# Patient Record
Sex: Female | Born: 1965 | Race: White | Hispanic: No | Marital: Married | Smoking: Never smoker
Health system: Southern US, Community
[De-identification: ages and names within clinical notes are randomized; demographics above are authoritative.]

## PROBLEM LIST (undated history)

## (undated) HISTORY — PX: WISDOM TOOTH EXTRACTION: SHX21

---

## 1998-01-30 ENCOUNTER — Inpatient Hospital Stay (HOSPITAL_COMMUNITY): Admission: AD | Admit: 1998-01-30 | Discharge: 1998-02-02 | Payer: Self-pay | Admitting: Obstetrics and Gynecology

## 1998-07-21 ENCOUNTER — Encounter (HOSPITAL_COMMUNITY): Admission: RE | Admit: 1998-07-21 | Discharge: 1998-10-19 | Payer: Self-pay | Admitting: *Deleted

## 1999-07-26 ENCOUNTER — Other Ambulatory Visit: Admission: RE | Admit: 1999-07-26 | Discharge: 1999-07-26 | Payer: Self-pay | Admitting: Obstetrics & Gynecology

## 2001-08-26 ENCOUNTER — Other Ambulatory Visit: Admission: RE | Admit: 2001-08-26 | Discharge: 2001-08-26 | Payer: Self-pay | Admitting: Obstetrics and Gynecology

## 2003-01-19 ENCOUNTER — Other Ambulatory Visit: Admission: RE | Admit: 2003-01-19 | Discharge: 2003-01-19 | Payer: Self-pay | Admitting: Obstetrics and Gynecology

## 2005-03-21 ENCOUNTER — Other Ambulatory Visit: Admission: RE | Admit: 2005-03-21 | Discharge: 2005-03-21 | Payer: Self-pay | Admitting: Obstetrics and Gynecology

## 2006-01-28 ENCOUNTER — Other Ambulatory Visit: Admission: RE | Admit: 2006-01-28 | Discharge: 2006-01-28 | Payer: Self-pay | Admitting: Obstetrics and Gynecology

## 2007-05-11 ENCOUNTER — Inpatient Hospital Stay (HOSPITAL_COMMUNITY): Admission: AD | Admit: 2007-05-11 | Discharge: 2007-05-13 | Payer: Self-pay | Admitting: Obstetrics and Gynecology

## 2009-04-12 ENCOUNTER — Ambulatory Visit: Payer: Self-pay | Admitting: Family Medicine

## 2009-09-11 DIAGNOSIS — B3731 Acute candidiasis of vulva and vagina: Secondary | ICD-10-CM | POA: Insufficient documentation

## 2009-09-11 DIAGNOSIS — R32 Unspecified urinary incontinence: Secondary | ICD-10-CM | POA: Insufficient documentation

## 2009-09-13 LAB — HM PAP SMEAR

## 2011-03-05 NOTE — H&P (Signed)
NAMEKWANA, RINGEL NO.:  [id number]   MEDICAL RECORD NO.:  [id number]          PATIENT TYPE:  INP   LOCATION:  9173                          FACILITY:  [facility]   PHYSICIAN:  Janine Limbo, M.D.DATE OF BIRTH:  06/13/1966   DATE OF ADMISSION:  05/11/2007  DATE OF DISCHARGE:                              HISTORY & PHYSICAL   Ms. Counsell is a 45 year old gravida 3, para 2-0-0-2, at 38-3/7 weeks,  who presents today in early labor.  Initially on admission her  contractions were 6-9 minutes apart, cervix was 2 cm, and fetal heart  rate was reactive.  She walked for a time in maternity admissions.  Upon  return after ambulation, her cervix was unchanged; however, she had two  mild variables.  She was sent for ultrasound for AFI and BPP.  BPP was  8/8; however, fluid was low at the 4th percentile.  Contractions by that  time had also picked up somewhat to approximately every 3-5 minutes so  the decision was made in light of the oligohydramnios and early labor to  go ahead and admit.  Pregnancy has been remarkable for:   1. Advanced maternal age this pregnancy with the patient declining      genetic testing.  2. PENICILLIN allergy.  3. Group B strep positive.  4. History of preeclampsia with her last pregnancy.   PRENATAL LABS:  Blood type is O positive, Rh antibody negative.  VDRL  nonreactive.  Rubella titer positive.  Hepatitis B surface antigen  negative.  HIV nonreactive.  GC and chlamydia cultures were negative the  first trimester.  HIV was also nonreactive.  Cystic fibrosis testing was  declined.  Hemoglobin upon entering the practice was 12.9.  It was 11.5  at 26 weeks.  Glucola was normal.  RPR was nonreactive.  Group B strep  culture was positive at 36 weeks.  EDC of May 22, 2007, was  established by ultrasound at approximately 12 weeks.   HISTORY OF PRESENT PREGNANCY:  The patient entered care at approximately  10 weeks by dates but approximately 14  weeks by size.  She had an  ultrasound at that time for dating purposes, which placed her at 12  weeks with an [facility] of May 22, 2007.  She declined all genetic testing  and amniocentesis.  She did have an ultrasound at 18 weeks showing  normal growth and development.  There was a previa noted on the late  first trimester ultrasound, and this was resolved by 18 weeks.  Adjusted  risk for Down syndrome was 1 in 262 after the genetic ultrasound.  She  had another ultrasound at 32 weeks for growth and fluid with no abnormal  findings.  Her Glucola was normal.  Her hemoglobin was also normal at 27  weeks.  Group B strep culture was positive at 36 weeks.  The rest of her  pregnancy has been uncomplicated.   OBSTETRICAL HISTORY:  In 1995 she had a vaginal birth of a female  infant, weight 6 pounds 8 ounces, at 40 weeks.  She was in labor several  days.  She had epidural anesthesia without complication.  In 1999 she  had a vaginal birth of a female infant, weight 6 pounds 8 ounces, at 40  weeks' gestation.  She was in labor 18 hours.  She had epidural  anesthesia.  She did have preeclampsia.   MEDICAL HISTORY:  She reports the usual childhood illnesses.  She has  had one UTI.  She fractured her left collar bone in 1983.   SURGICAL HISTORY:  Wisdom teeth in 1989.   Her only other hospitalizations were for childbirth and for mono and  strep in 1984.   She is allergic to PENICILLIN.   FAMILY HISTORY:  Her father had a heart attack.  Father also had  hypertension.  Her niece had asthma.  Her sister has multiple sclerosis.  Her father has Alzheimer's.  Her mother has rheumatoid arthritis.   GENETIC HISTORY:  Remarkable for the patient's age of 10.  The father of  the baby's cousin has a cleft palate and father of the baby's sisters  have heart defects, were deceased as infants.   SOCIAL HISTORY:  The patient is married to the father of the baby.  He  is involved and supportive.  His name is  [employer], Arts administrator.  The patient  is Caucasian and Hispanic in ethnicity.  She is of the [employer].  She is graduate-educated.  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  Her  husband is graduate-educated, and he is employed as a Runner, broadcasting/film/video.  She has  been followed by the certified nurse midwife service at [facility].  She denies any alcohol, drug or tobacco use during this pregnancy.   PHYSICAL EXAMINATION:  VITAL SIGNS:  Stable.  The patient is afebrile.  HEENT:  Within normal limits.  LUNGS:  Bilateral breath sounds are clear.  HEART: Regular rate and rhythm without murmur.  BREASTS:  Soft and nontender.  ABDOMEN:  Fundal height is approximately 38 cm.  Estimated fetal weight  is 7 to 7-1/2 pounds.  Uterine contractions are every 3-5 minutes,  moderate quality.  Cervical exam is 2 cm, 60%, vertex at a -1 station.  Fetal heart rate is reassuring.  There are some occasional mild  variables with some contractions but the fetal heart rate does go  through cycles of reactivity.  The variables seem to occur when the baby  is in a sleep cycle.  EXTREMITIES:  Deep tendon reflexes are 2+ without clonus.  There is a  trace edema noted.   IMPRESSION:  1. Intrauterine pregnancy at 38-3/7 weeks.  2. Early labor.  3. Oligohydramnios.  4. Positive group B strep with PENICILLIN allergy.   PLAN:  1. Admit to birthing suite per consult with Dr. Marline Backbone as      attending physician.  2. Routine certified nurse midwife orders.  3. Will plan group B strep prophylaxis with clindamycin per standard      dosing.  4. Will anticipate artificial rupture of membranes after clindamycin      dose has been infused.  5. The patient will desire epidural as labor progresses.     Renaldo Reel Emilee Hero, C.N.M.      Janine Limbo, M.D.  Electronically Signed   VLL/MEDQ  D:  05/11/2007  T:  05/11/2007  Job:  [id number]

## 2011-08-05 LAB — CBC
Hemoglobin: 10.7 — ABNORMAL LOW
Platelets: 137 — ABNORMAL LOW
RBC: 3.53 — ABNORMAL LOW
RDW: 14
WBC: 9.8

## 2011-08-05 LAB — RPR: RPR Ser Ql: NONREACTIVE

## 2012-10-19 ENCOUNTER — Telehealth: Payer: Self-pay | Admitting: Obstetrics and Gynecology

## 2012-11-12 ENCOUNTER — Encounter: Payer: Self-pay | Admitting: Obstetrics and Gynecology

## 2012-11-12 ENCOUNTER — Ambulatory Visit: Payer: BC Managed Care – PPO | Admitting: Obstetrics and Gynecology

## 2012-11-12 ENCOUNTER — Other Ambulatory Visit: Payer: Self-pay | Admitting: Obstetrics and Gynecology

## 2012-11-12 VITALS — BP 110/62 | Ht 61.0 in | Wt 142.0 lb

## 2012-11-12 DIAGNOSIS — IMO0001 Reserved for inherently not codable concepts without codable children: Secondary | ICD-10-CM

## 2012-11-12 DIAGNOSIS — Z01419 Encounter for gynecological examination (general) (routine) without abnormal findings: Secondary | ICD-10-CM

## 2012-11-12 DIAGNOSIS — Z1231 Encounter for screening mammogram for malignant neoplasm of breast: Secondary | ICD-10-CM

## 2012-11-12 MED ORDER — DIAPHRAGM ARC-SPRING 75 MM VA DPRH: 1.0000 | VAGINAL_INSERT | VAGINAL | Status: AC | PRN

## 2012-11-12 NOTE — Progress Notes (Unsigned)
Subjective:    Jodi Vincent is a 47 y.o. female, G3P3003, who presents for an annual exam.   Patient reports:  ***.    History   Social History  . Marital Status: Married    Spouse Name: N/A    Number of Children: N/A  . Years of Education: N/A   Social History Main Topics  . Smoking status: None  . Smokeless tobacco: Never Used  . Alcohol Use: Yes  . Drug Use: No  . Sexually Active: Yes    Birth Control/ Protection: Diaphragm   Other Topics Concern  . None   Social History Narrative  . None    Menstrual cycle:   LMP: Patient's last menstrual period was 11/08/2012.           Cycle: ***  The following portions of the patient's history were reviewed and updated as appropriate: allergies, current medications, past family history, past medical history, past social history, past surgical history and problem list.  Review of Systems Pertinent items are noted in HPI. Breast:Negative for breast lump,nipple discharge or nipple retraction Gastrointestinal: Negative for abdominal pain, change in bowel habits or rectal bleeding Urinary:negative   Objective:    BP 110/62  Ht 5\' 1"  (1.549 m)  Wt 142 lb (64.411 kg)  BMI 26.83 kg/m2  LMP 11/08/2012    Weight:  Wt Readings from Last 1 Encounters:  11/12/12 142 lb (64.411 kg)          BMI: Body mass index is 26.83 kg/(m^2).  General Appearance: Alert, appropriate appearance for age. No acute distress HEENT: Grossly normal Neck / Thyroid: Supple, no masses, nodes or enlargement Lungs: clear to auscultation bilaterally Back: No CVA tenderness Breast Exam: {exam; breast:30839::"No masses or nodes.No dimpling, nipple retraction or discharge."} Cardiovascular: Regular rate and rhythm. S1, S2, no murmur Gastrointestinal: Soft, non-tender, no masses or organomegaly Pelvic Exam: {Exam; pelvic:30843} Rectovaginal: {rectal female:311646::"not indicated"} Lymphatic Exam: Non-palpable nodes in neck, clavicular, axillary, or  inguinal regions Skin: no rash or abnormalities Neurologic: Normal gait and speech, no tremor  Psychiatric: Alert and oriented, appropriate affect.   Wet Prep:{pe wet prep/koh:313109::"not applicable"} Urinalysis:{Urine dipstick components:5375::"not applicable"} UPT: {FINDINGS; POSITIVE NEGATIVE:7244569109::"Not done"}   Assessment:    {diagnoses; exam gyn:13148}    Plan:    Mammogram: *** Pap:  *** STD screening: {DESC; DONE/DISCUSSED/DECLINED:14570} Contraception:{PLAN CONTRACEPTION:313102} Other:  ***   Check diaphragm size Mamogram   Derral Colucci, [place], [place]

## 2012-11-12 NOTE — Progress Notes (Unsigned)
Last Pap: 2008 ?  WNL: Yes Regular Periods:yes Contraception: diaphragm   Monthly Breast exam:no Tetanus<66yrs:yes Nl.Bladder Function:yes Daily BMs:yes Healthy Diet:yes Calcium:yes Mammogram:no Exercise:yes Have often Exercise: 3-5 times a week  Seatbelt: yes Abuse at home: no Stressful work:yes PCP: pt stated she doesn't have one  Change in PMH: unchanged Change in [facility] unchanged  Pt stated no issues today.

## 2012-11-13 LAB — PAP IG W/ RFLX HPV ASCU

## 2012-11-24 DIAGNOSIS — B379 Candidiasis, unspecified: Secondary | ICD-10-CM

## 2012-11-24 DIAGNOSIS — B373 Candidiasis of vulva and vagina: Secondary | ICD-10-CM

## 2012-11-24 DIAGNOSIS — B3731 Acute candidiasis of vulva and vagina: Secondary | ICD-10-CM

## 2012-11-24 DIAGNOSIS — R32 Unspecified urinary incontinence: Secondary | ICD-10-CM

## 2012-12-08 ENCOUNTER — Ambulatory Visit
Admission: RE | Admit: 2012-12-08 | Discharge: 2012-12-08 | Disposition: A | Discharge status: Home / Self Care | Payer: BC Managed Care – PPO | Source: Ambulatory Visit | Attending: Obstetrics and Gynecology | Admitting: Obstetrics and Gynecology

## 2012-12-08 DIAGNOSIS — Z1231 Encounter for screening mammogram for malignant neoplasm of breast: Secondary | ICD-10-CM

## 2013-08-13 IMAGING — MG MM DIGITAL SCREENING BILAT
4 series · 4 of 4 positions shown · non-contrast
Comparison: None.

CLINICAL DATA: Screening.  Baseline.

DIGITAL BILATERAL SCREENING MAMMOGRAM WITH CAD

[R CC]
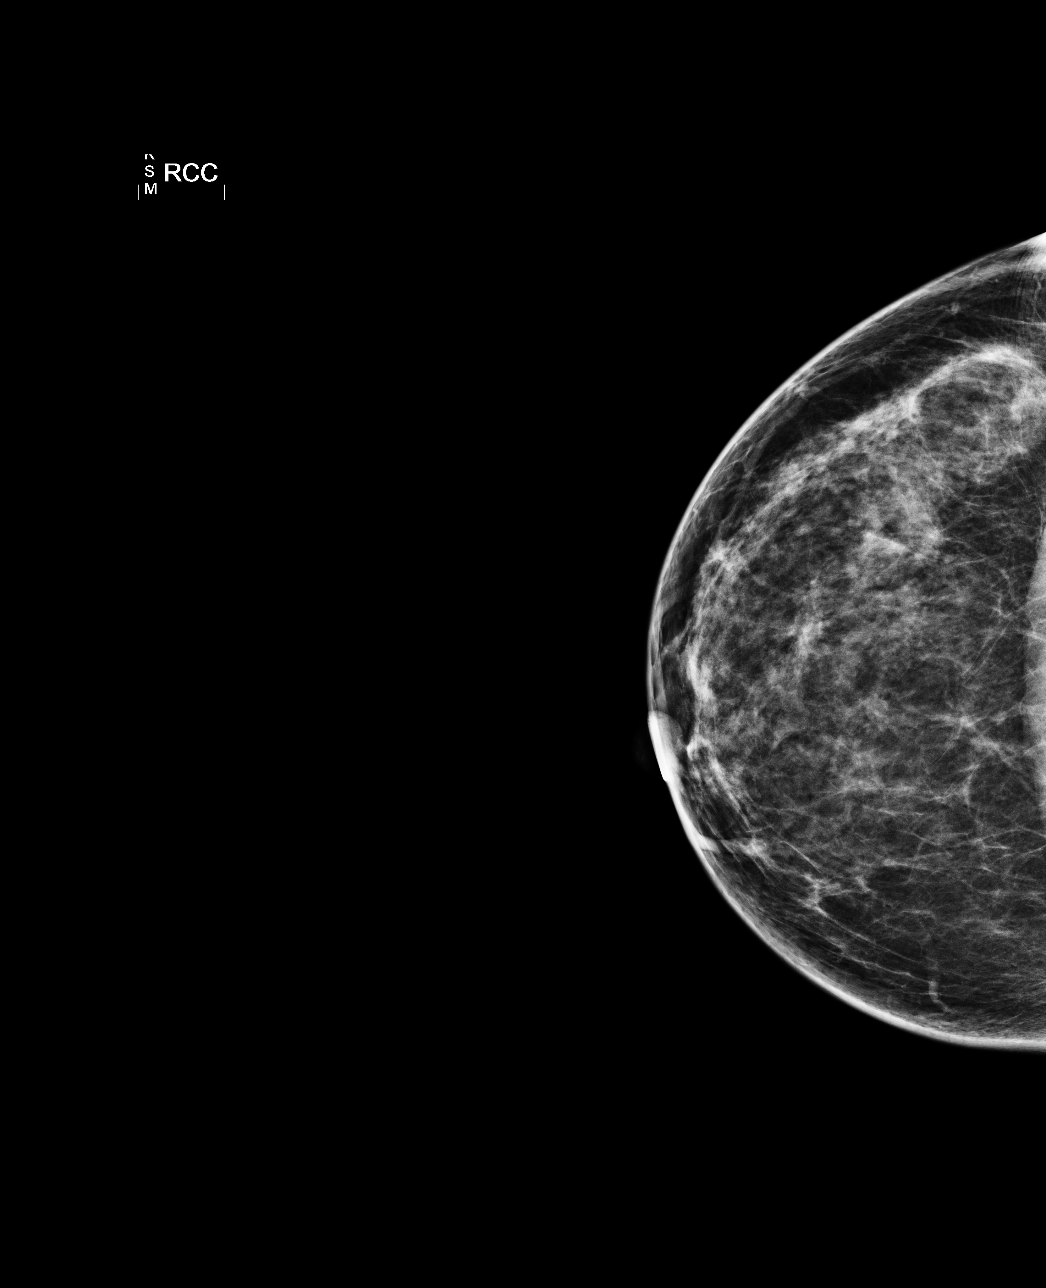

[L CC]
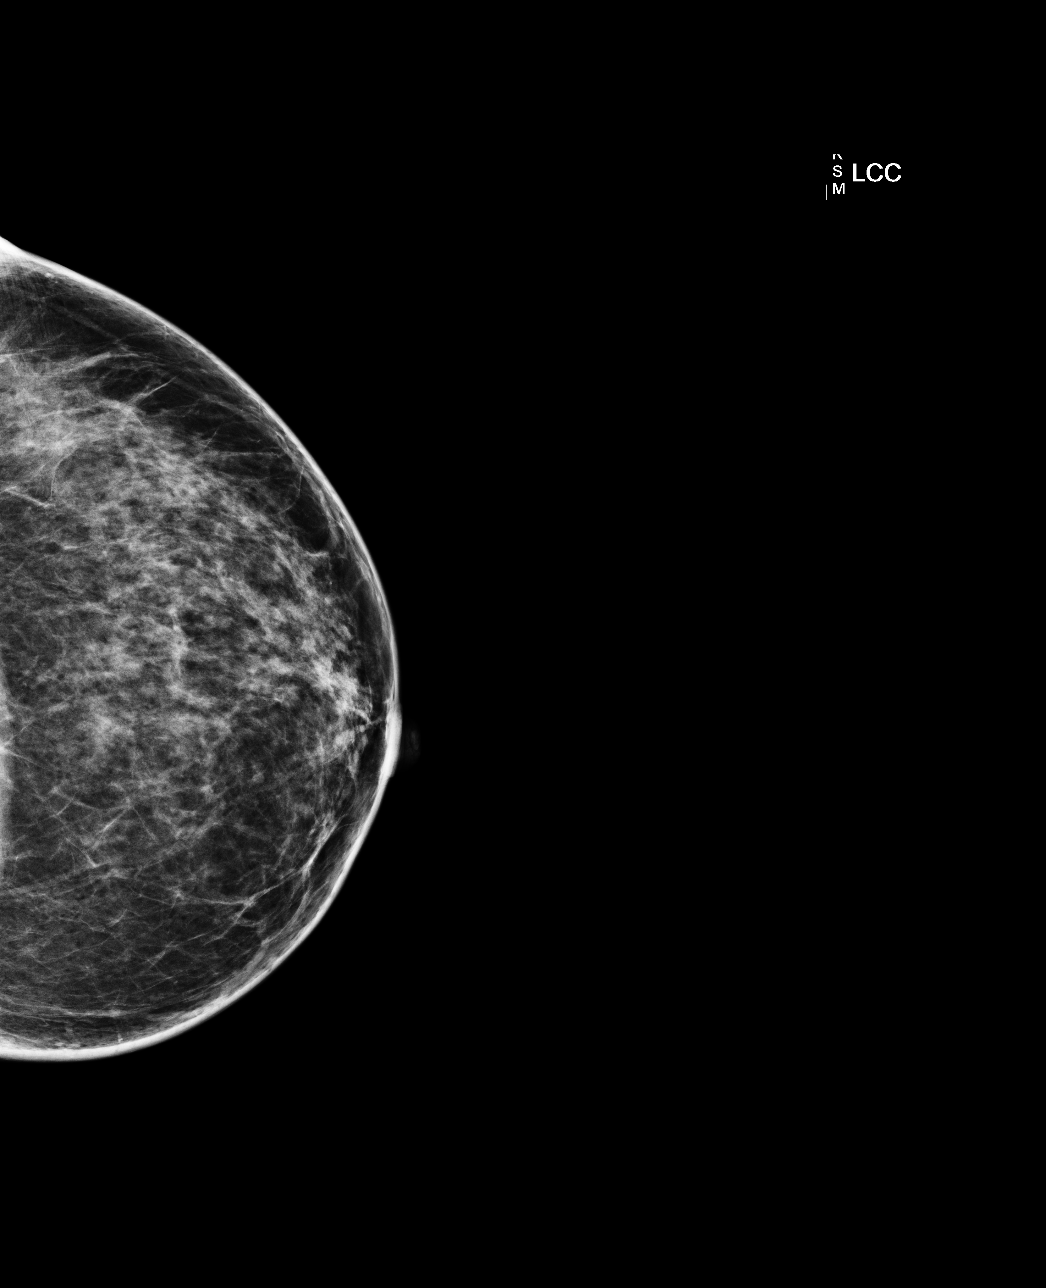

[L MLO]
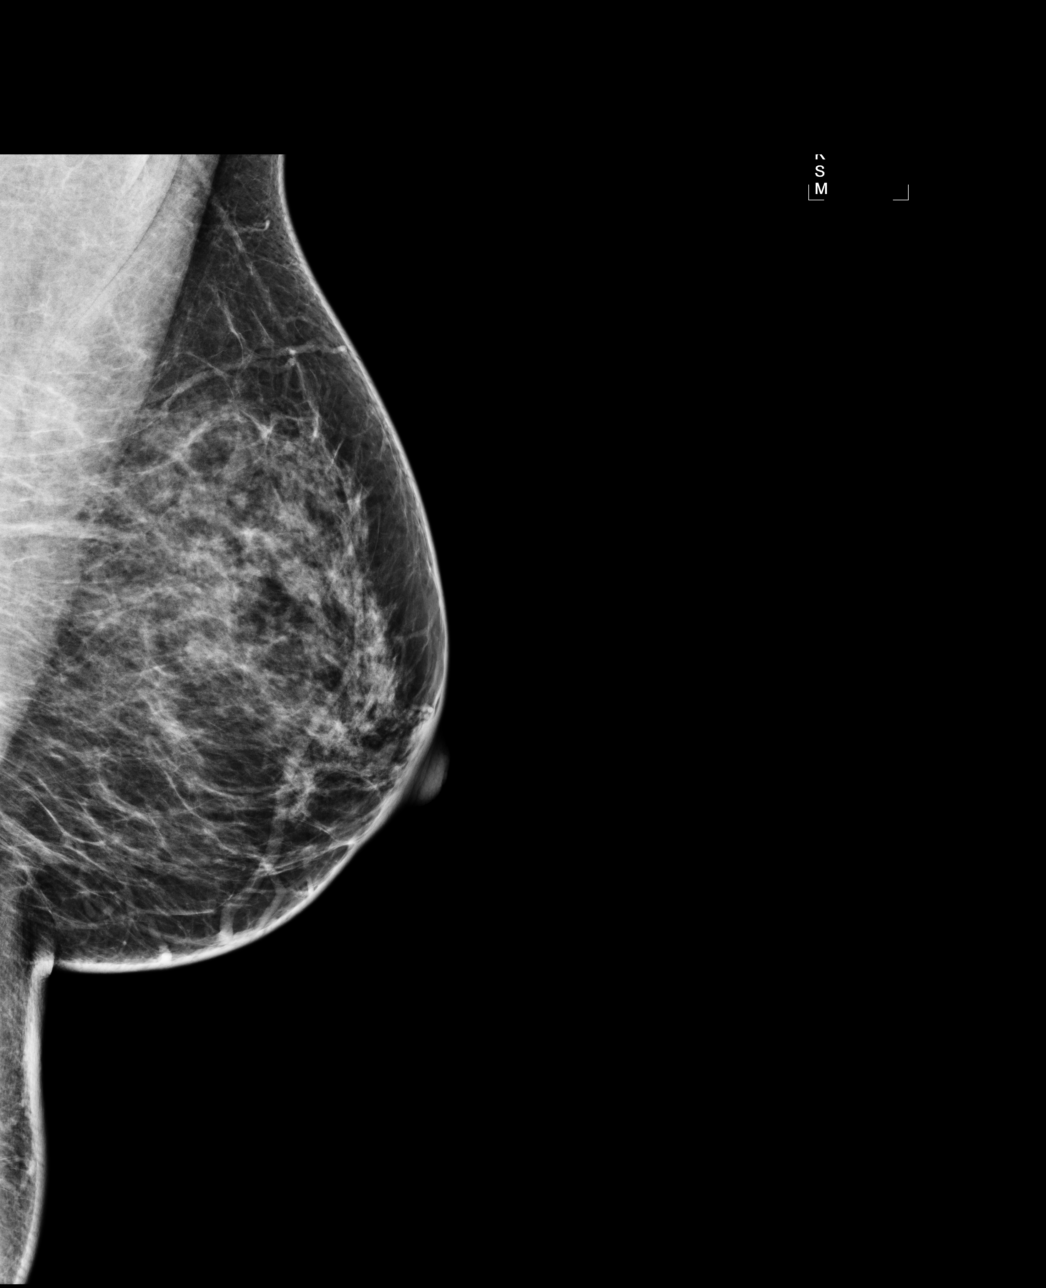

[R MLO]
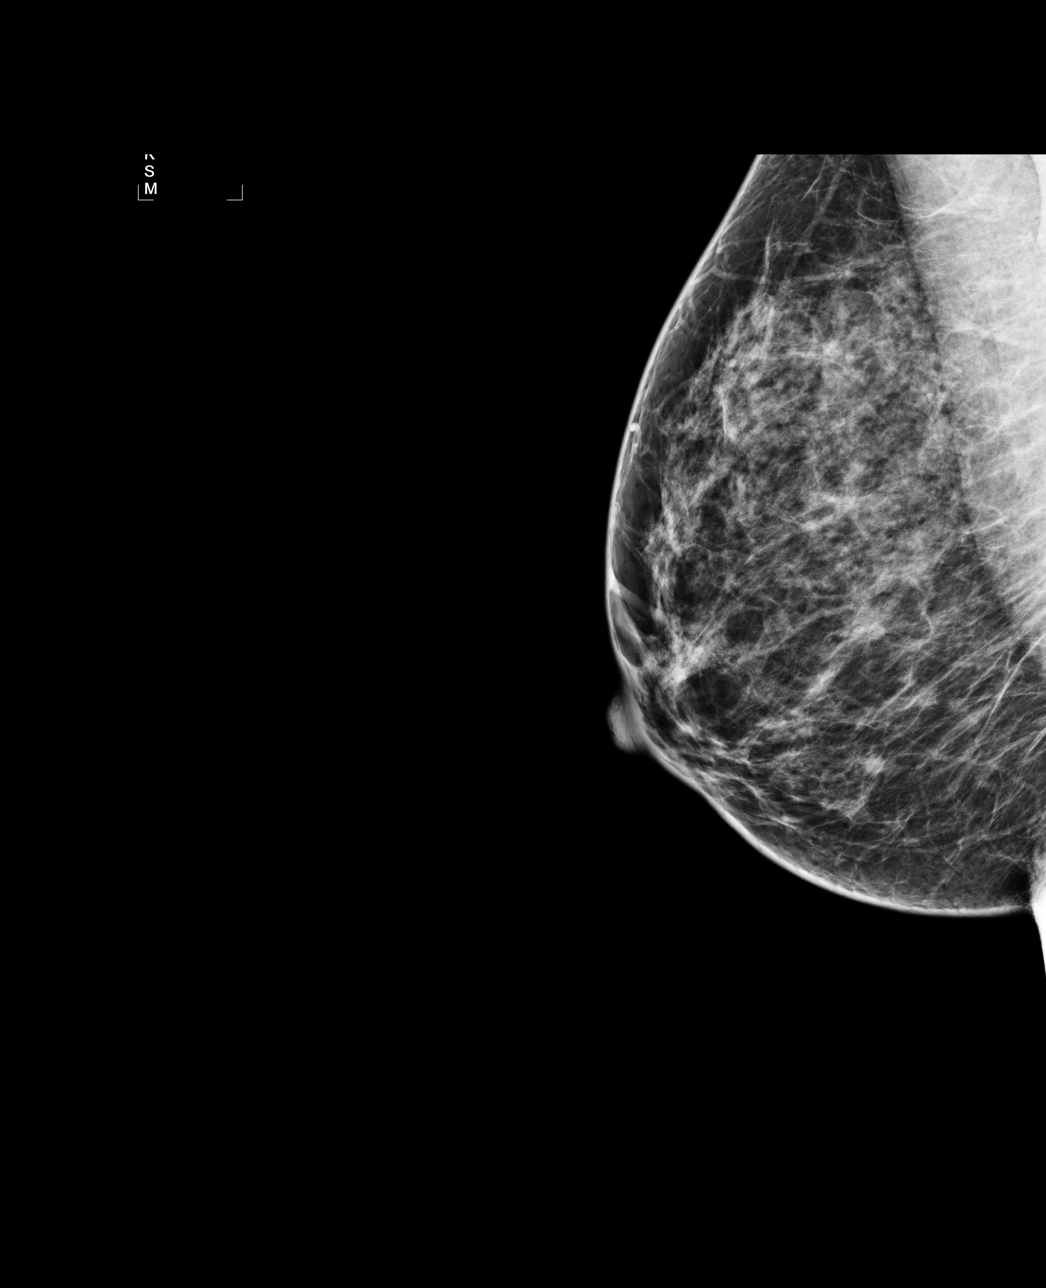

[4 of 4 positions shown; findings below may reference images not displayed]

FINDINGS: ACR Breast Density Category 3: The breast tissue is heterogeneously
dense.

No suspicious masses, architectural distortion, or calcifications
are present.

Images were processed with CAD.
IMPRESSION: No mammographic evidence of malignancy.

A result letter of this screening mammogram will be mailed directly
to the patient.

RECOMMENATION:
Screening mammogram in one year. (Code:ZI-Q-Y58)

BI-RADS CATEGORY 1:  Negative.

## 2016-07-24 ENCOUNTER — Ambulatory Visit (INDEPENDENT_AMBULATORY_CARE_PROVIDER_SITE_OTHER): Payer: BLUE CROSS/BLUE SHIELD | Admitting: Orthopedic Surgery

## 2016-07-24 DIAGNOSIS — S92351D Displaced fracture of fifth metatarsal bone, right foot, subsequent encounter for fracture with routine healing: Secondary | ICD-10-CM

## 2016-08-16 ENCOUNTER — Ambulatory Visit (INDEPENDENT_AMBULATORY_CARE_PROVIDER_SITE_OTHER): Payer: BLUE CROSS/BLUE SHIELD | Admitting: Orthopedic Surgery

## 2016-08-16 ENCOUNTER — Ambulatory Visit (INDEPENDENT_AMBULATORY_CARE_PROVIDER_SITE_OTHER): Payer: BLUE CROSS/BLUE SHIELD

## 2016-08-16 ENCOUNTER — Encounter (INDEPENDENT_AMBULATORY_CARE_PROVIDER_SITE_OTHER): Payer: Self-pay | Admitting: Orthopedic Surgery

## 2016-08-16 DIAGNOSIS — S92354D Nondisplaced fracture of fifth metatarsal bone, right foot, subsequent encounter for fracture with routine healing: Secondary | ICD-10-CM

## 2016-08-16 NOTE — Progress Notes (Signed)
   Office Visit Note   Patient: Jodi Vincent           Date of Birth: [birth year]           MRN: [id number] Visit Date: 08/16/2016 Requested by: Jackie PlumGeorge Osei-Bonsu, MD [id number] [place], [place] [id number] PCP: Jackie PlumSEI-BONSU,GEORGE, MD  Subjective: Chief Complaint  Patient presents with  . Right Foot - Follow-up, Fracture    Patient is here in followup for right fifth metatarsal fracture, DOI 06/29/16.  Doing well.  No pain.  She has only put minimal weight on it, still using the knee scooter.  She is still wearing the fracture boot.  She tried to walk on it last Saturday, and felt too much pressure.  Not taking any meds for this.                  Review of Systems   Assessment & Plan: Visit Diagnoses:  1. Closed nondisplaced fracture of fifth metatarsal bone of right foot with routine healing, subsequent encounter     Plan: The lungs 50 year old female who is now about 7 weeks out from fifth metatarsal fracture doing recently well she is in a fracture boot as well as a knee scooter on exam mild tenderness to palpation of the fracture particularly with pronation supination of the foot radiographs do show a little bit of fracture gapping but there is some healing medially.  Plan at this time is progressive weightbearing as tolerated transitioning out of the scooter but into the fracture boot repeat radiographs on return in 4 weeks  Follow-Up Instructions: No Follow-up on file.   Orders:  No orders of the defined types were placed in this encounter.  No orders of the defined types were placed in this encounter.     Procedures: No procedures performed   Clinical Data: No additional findings.  Objective: Vital Signs: There were no vitals taken for this visit.  Physical Exam  Ortho Exam  Specialty Comments:  No specialty comments available.  Imaging: No results found.   PMFS History: Patient Active Problem List   Diagnosis Date Noted  . Yeast  vaginitis 09/11/2009  . Incontinence 09/11/2009   No past medical history on file.  Family History  Problem Relation Age of Onset  . Heart attack Paternal Grandfather   . Heart attack Paternal Grandmother   . Heart attack Father   . Stroke Father   . Hypertension Father   . Alzheimer's disease Father   . Arthritis Mother   . Hypertension Mother     Past Surgical History:  Procedure Laterality Date  . WISDOM TOOTH EXTRACTION     Social History   Occupational History  . Not on file.   Social History Main Topics  . Smoking status: Never Smoker  . Smokeless tobacco: Never Used  . Alcohol use Yes  . Drug use: No  . Sexual activity: Yes    Birth control/ protection: Diaphragm

## 2016-09-16 ENCOUNTER — Ambulatory Visit (INDEPENDENT_AMBULATORY_CARE_PROVIDER_SITE_OTHER): Payer: BLUE CROSS/BLUE SHIELD | Admitting: Orthopedic Surgery

## 2016-09-16 ENCOUNTER — Encounter (INDEPENDENT_AMBULATORY_CARE_PROVIDER_SITE_OTHER): Payer: Self-pay | Admitting: Orthopedic Surgery

## 2016-09-16 ENCOUNTER — Ambulatory Visit (INDEPENDENT_AMBULATORY_CARE_PROVIDER_SITE_OTHER): Payer: BLUE CROSS/BLUE SHIELD

## 2016-09-16 DIAGNOSIS — S92354D Nondisplaced fracture of fifth metatarsal bone, right foot, subsequent encounter for fracture with routine healing: Secondary | ICD-10-CM

## 2016-09-16 NOTE — Progress Notes (Signed)
   Post-Op Visit Note   Patient: Jodi Vincent           Date of Birth: 1965/12/28           MRN: [id number] Visit Date: 09/16/2016 PCP: Jackie PlumSEI-BONSU,GEORGE, MD   Assessment & Plan:  Chief Complaint:  Chief Complaint  Patient presents with  . Right Foot - Fracture, Follow-up   Visit Diagnoses:  1. Closed nondisplaced fracture of fifth metatarsal bone of right foot with routine healing, subsequent encounter     Plan: Jodi Vincent is a patient who is now 3 months out from right fifth metatarsal fracture base.  This is more of an avulsion type fracture as opposed to Jones fracture.  She is weightbearing and doing well with minimal pain.  She has been in a fracture boot.  On exam she has minimal to no tenderness at the fracture site.  No pain with eversion of the foot.  Mild swelling is present.  Radiographs show partial healing.  Plan at this time is to progress with activity as tolerated in terms of walking out of the fracture boot with 4 week return for clinical recheck.  She may go on to a asymptomatic fibrous nonunion-type situation but she does have some healing on the medial aspect compression side of the fracture  Follow-Up Instructions: Return in about 4 weeks (around 10/14/2016).   Orders:  Orders Placed This Encounter  Procedures  . XR Foot Complete Right   No orders of the defined types were placed in this encounter.    PMFS History: Patient Active Problem List   Diagnosis Date Noted  . Yeast vaginitis 09/11/2009  . Incontinence 09/11/2009   No past medical history on file.  Family History  Problem Relation Age of Onset  . Heart attack Paternal Grandfather   . Heart attack Paternal Grandmother   . Heart attack Father   . Stroke Father   . Hypertension Father   . Alzheimer's disease Father   . Arthritis Mother   . Hypertension Mother     Past Surgical History:  Procedure Laterality Date  . WISDOM TOOTH EXTRACTION     Social History   Occupational History  .  Not on file.   Social History Main Topics  . Smoking status: Never Smoker  . Smokeless tobacco: Never Used  . Alcohol use Yes  . Drug use: No  . Sexual activity: Yes    Birth control/ protection: Diaphragm

## 2016-10-23 ENCOUNTER — Ambulatory Visit (INDEPENDENT_AMBULATORY_CARE_PROVIDER_SITE_OTHER): Payer: BLUE CROSS/BLUE SHIELD | Admitting: Orthopedic Surgery

## 2016-10-30 ENCOUNTER — Encounter (INDEPENDENT_AMBULATORY_CARE_PROVIDER_SITE_OTHER): Payer: Self-pay | Admitting: Orthopedic Surgery

## 2016-10-30 ENCOUNTER — Ambulatory Visit (INDEPENDENT_AMBULATORY_CARE_PROVIDER_SITE_OTHER): Payer: Self-pay

## 2016-10-30 ENCOUNTER — Ambulatory Visit (INDEPENDENT_AMBULATORY_CARE_PROVIDER_SITE_OTHER): Payer: BLUE CROSS/BLUE SHIELD | Admitting: Orthopedic Surgery

## 2016-10-30 DIAGNOSIS — S92355D Nondisplaced fracture of fifth metatarsal bone, left foot, subsequent encounter for fracture with routine healing: Secondary | ICD-10-CM | POA: Insufficient documentation

## 2016-10-30 NOTE — Progress Notes (Signed)
Office Visit Note   Patient: Jodi Vincent           Date of Birth: 04-13-1966           MRN: [id number] Visit Date: 10/30/2016 Requested by: Jackie Plum, MD [address] [place], [place] [address] PCP: Jackie Plum, MD  Subjective: Chief Complaint  Patient presents with  . Right Foot - Follow-up, Fracture    HPI patient is a 51 year old female here for follow-up right fifth metatarsal fracture 06/29/2016.  In general she's doing well.  She is wearing regular shoes.  She does describe some ankle stiffness which is affecting her gait.  She wants to be healed to eventually return to running.  Not taking any medications currently.  She did do a little bit too much recently where she packed upper kitchen and that made the foot hurts him but it responded well the next day.  She is able to go upstairs without difficulty going downstairs is a little bit more difficult because of her ankle stiffness.              Review of Systems All systems reviewed are negative as they relate to the chief complaint within the history of present illness.  Patient denies  fevers or chills.    Assessment & Plan: Visit Diagnoses:  1. Closed nondisplaced fracture of fifth metatarsal bone of left foot with routine healing     Plan: Impression is healing right fifth metatarsal fracture.  She does have interval healing based on subsequent x-rays obtained today.  I'm let her start some physical therapy for gait training to work out some of that ankle stiffness.  Would not really start running until May or June.  When she does so it needs to be a run walk or walk jog-type situation and not flat out running.  I'll see her back as needed  Follow-Up Instructions: No Follow-up on file.   Orders:  Orders Placed This Encounter  Procedures  . XR Foot Complete Right   No orders of the defined types were placed in this encounter.     Procedures: No procedures performed   Clinical  Data: No additional findings.  Objective: Vital Signs: There were no vitals taken for this visit.  Physical Exam   Constitutional: Patient appears well-developed HEENT:  Head: Normocephalic Eyes:EOM are normal Neck: Normal range of motion Cardiovascular: Normal rate Pulmonary/chest: Effort normal Neurologic: Patient is alert Skin: Skin is warm Psychiatric: Patient has normal mood and affect    Ortho Exam examination the right foot demonstrates no tenderness at the base of the fifth metatarsal.  She has excellent ankle inversion strength.  Lacks about 5-8 of full ankle dorsiflexion right versus left as well as plantar flexion right versus left.  No pain with pronation supination of the forefoot.  No paresthesias in the foot.  Specialty Comments:  No specialty comments available.  Imaging: Xr Foot Complete Right  Result Date: 10/30/2016 Right foot 3 views reviewed.  Base of the fifth metatarsal fracture is noted.  There's been no additional displacement and there is been some interval bone bridging across the fracture site.    PMFS History: Patient Active Problem List   Diagnosis Date Noted  . Closed nondisplaced fracture of fifth metatarsal bone of left foot with routine healing 10/30/2016  . Yeast vaginitis 09/11/2009  . Incontinence 09/11/2009   No past medical history on file.  Family History  Problem Relation Age of Onset  . Heart  attack Paternal Grandfather   . Heart attack Paternal Grandmother   . Heart attack Father   . Stroke Father   . Hypertension Father   . Alzheimer's disease Father   . Arthritis Mother   . Hypertension Mother     Past Surgical History:  Procedure Laterality Date  . WISDOM TOOTH EXTRACTION     Social History   Occupational History  . Not on file.   Social History Main Topics  . Smoking status: Never Smoker  . Smokeless tobacco: Never Used  . Alcohol use Yes  . Drug use: No  . Sexual activity: Yes    Birth control/  protection: Diaphragm
